# Patient Record
Sex: Male | Born: 1970 | Race: White | Hispanic: Yes | Marital: Single | Smoking: Current some day smoker
Health system: Southern US, Community
[De-identification: ages and names within clinical notes are randomized; demographics above are authoritative.]

## PROBLEM LIST (undated history)

## (undated) DIAGNOSIS — C801 Malignant (primary) neoplasm, unspecified: Secondary | ICD-10-CM

## (undated) HISTORY — PX: OTHER SURGICAL HISTORY: SHX169

## (undated) HISTORY — PX: REPLACEMENT TOTAL KNEE: SUR1224

---

## 2016-10-13 ENCOUNTER — Emergency Department (HOSPITAL_COMMUNITY)
Admission: EM | Admit: 2016-10-13 | Discharge: 2016-10-13 | Disposition: A | Discharge status: Home / Self Care | Payer: Self-pay | Attending: Emergency Medicine | Admitting: Emergency Medicine

## 2016-10-13 ENCOUNTER — Emergency Department (HOSPITAL_COMMUNITY): Payer: Self-pay

## 2016-10-13 ENCOUNTER — Encounter (HOSPITAL_COMMUNITY): Payer: Self-pay | Admitting: Emergency Medicine

## 2016-10-13 DIAGNOSIS — Y929 Unspecified place or not applicable: Secondary | ICD-10-CM | POA: Insufficient documentation

## 2016-10-13 DIAGNOSIS — X501XXA Overexertion from prolonged static or awkward postures, initial encounter: Secondary | ICD-10-CM | POA: Insufficient documentation

## 2016-10-13 DIAGNOSIS — Y999 Unspecified external cause status: Secondary | ICD-10-CM | POA: Insufficient documentation

## 2016-10-13 DIAGNOSIS — Y939 Activity, unspecified: Secondary | ICD-10-CM | POA: Insufficient documentation

## 2016-10-13 DIAGNOSIS — M79672 Pain in left foot: Secondary | ICD-10-CM | POA: Insufficient documentation

## 2016-10-13 DIAGNOSIS — Z96659 Presence of unspecified artificial knee joint: Secondary | ICD-10-CM | POA: Insufficient documentation

## 2016-10-13 NOTE — Discharge Instructions (Signed)
Rest - please stay off foot as much as possible until you can bear weight Ice - ice for 20 minutes at a time, several times a day Compression - wear brace to provide support Elevate - elevate foot above level of heart Ibuprofen - take with food. Take up to 3-4 times daily

## 2016-10-13 NOTE — Progress Notes (Signed)
Orthopedic Tech Progress Note Patient Details:  Jared Conley 01/07/1971 [id number]  Ortho Devices Type of Ortho Device: Crutches, Postop shoe/boot, Ace wrap Ortho Device/Splint Location: Lt foot Ortho Device/Splint Interventions: Application   [name] 10/13/2016, 12:23 PM

## 2016-10-13 NOTE — ED Provider Notes (Signed)
[facility] DEPT Provider Note   CSN: [id number] Arrival date & time: 10/13/16  0941  By signing my name below, I, Sonum Patel, attest that this documentation has been prepared under the direction and in the presence of [employer], PA-C. Electronically Signed: Sonum Patel, Education administrator. 10/13/16. 12:00 PM.  History   Chief Complaint Chief Complaint  Patient presents with  . Foot Pain    The history is provided by the patient. No language interpreter was used.     HPI Comments: Jared Conley is a 46 y.o. male who presents to the Emergency Department complaining of a left foot injury that occurred last night. PMHx significant for extensive left leg surgery due to bone cancer 15 years ago. He states he was walking up steps when he twisted his left foot. He reports current constant left foot and ankle pain with associated mild swelling. He states the pain is worse with ambulation and has difficulty moving his ankle from a prior surgery. He denies numbness or wounds.   History reviewed. No pertinent past medical history.  There are no active problems to display for this patient.   Past Surgical History:  Procedure Laterality Date  . REPLACEMENT TOTAL KNEE         Home Medications    Prior to Admission medications   Not on File    Family History Family History  Problem Relation Age of Onset  . Hypertension Father     Social History Social History  Substance Use Topics  . Smoking status: Never Smoker  . Smokeless tobacco: Never Used  . Alcohol use Yes     Comment: occ     Allergies   Patient has no allergy information on record.   Review of Systems Review of Systems  Musculoskeletal: Positive for arthralgias and joint swelling.  Skin: Negative for wound.  Neurological: Negative for numbness.     Physical Exam Updated Vital Signs BP 118/72 (BP Location: Left Arm)   Pulse 66   Temp 97.8 F (36.6 C) (Oral)   Resp 12   Ht 5\' 5"  (1.651 m)   Wt 175 lb  (79.4 kg)   SpO2 98%   BMI 29.12 kg/m   Physical Exam  Constitutional: He is oriented to person, place, and time. He appears well-developed and well-nourished.  HENT:  Head: Normocephalic and atraumatic.  Cardiovascular: Normal rate.   Pulmonary/Chest: Effort normal.  Musculoskeletal: He exhibits tenderness. He exhibits no edema or deformity.  LLE: No obvious swelling or deformity. Well healed surgical scar starts at mid shin and goes upward. Unable to range his left ankle which he has at baseline. Full ROM of toes. Tenderness along medial aspect of foot and plantar aspect of foot. NVI.   Neurological: He is alert and oriented to person, place, and time.  Skin: Skin is warm and dry.  Psychiatric: He has a normal mood and affect.  Nursing note and vitals reviewed.    ED Treatments / Results  DIAGNOSTIC STUDIES: Oxygen Saturation is 98% on RA, normal by my interpretation.    COORDINATION OF CARE: 11:59 AM Discussed treatment plan with pt at bedside and pt agreed to plan.   Labs (all labs ordered are listed, but only abnormal results are displayed) Labs Reviewed - No data to display  EKG  EKG Interpretation None       Radiology Dg Foot Complete Left  Result Date: 10/13/2016 CLINICAL DATA:  Twisted ankle EXAM: LEFT FOOT - COMPLETE 3+ VIEW COMPARISON:  None.  FINDINGS: Negative for fracture. Negative for arthropathy. Rod in the distal tibia. IMPRESSION: Negative for fracture. Electronically Signed   By: Franchot Gallo M.D.   On: 10/13/2016 11:36    Procedures Procedures (including critical care time)  Medications Ordered in ED Medications - No data to display   Initial Impression / Assessment and Plan / ED Course  I have reviewed the triage vital signs and the nursing notes.  Pertinent labs & imaging results that were available during my care of the patient were reviewed by me and considered in my medical decision making (see chart for details).  Patient X-Ray  negative for obvious fracture or dislocation.  Pt advised to follow up with orthopedics. Patient given crutches, ACE wrap, and post-op shoe while in ED, conservative therapy recommended and discussed. Patient will be discharged home & is agreeable with above plan. Returns precautions discussed. Pt appears safe for discharge.   Final Clinical Impressions(s) / ED Diagnoses   Final diagnoses:  Foot pain, left    New Prescriptions New Prescriptions   No medications on file   I personally performed the services described in this documentation, which was scribed in my presence. The recorded information has been reviewed and is accurate.    Recardo Evangelist, PA-C 10/13/16 [place], PA-C 10/13/16 [place], MD 10/15/16 2030

## 2016-10-13 NOTE — ED Triage Notes (Signed)
Pt c/o l/foot pain after twisting foot last night. Ptt drove self to ED. Pt reports that he is unable to walk due to pain

## 2016-10-13 NOTE — ED Notes (Signed)
Bed: WTR6 Expected date:  Expected time:  Means of arrival:  Comments: 

## 2016-10-13 NOTE — Progress Notes (Signed)
Prior to pt d/c CM spoke with pt with no pcp.  CM discussed and provided written information to assist pt with determining choice for uninsured accepting pcps, discussed the importance of pcp vs EDP services for f/u care, www.needymeds.org, www.goodrx.com, discounted pharmacies and other [employer] such as [employer] [facility] [employer], affordable care act, financial assistance, uninsured dental services, [place] med assist, DSS and  health department  Reviewed resources for [employer] uninsured accepting pcps like [facility], family medicine at [employer], [facility], [facility], local urgent care centers, [facility], [facility] family practice, general medical clinics, family services of the [place], [facility] urgent care plus others, medication resources, [facility] out patient pharmacies and housing Pt voiced understanding and appreciation of resources provided   Provided [facility] contact information

## 2021-12-31 ENCOUNTER — Encounter (HOSPITAL_COMMUNITY): Payer: Self-pay

## 2021-12-31 ENCOUNTER — Other Ambulatory Visit: Payer: Self-pay

## 2021-12-31 ENCOUNTER — Emergency Department (HOSPITAL_COMMUNITY)
Admission: EM | Admit: 2021-12-31 | Discharge: 2022-01-01 | Disposition: A | Discharge status: Home / Self Care | Payer: Self-pay | Attending: Emergency Medicine | Admitting: Emergency Medicine

## 2021-12-31 DIAGNOSIS — Z85830 Personal history of malignant neoplasm of bone: Secondary | ICD-10-CM | POA: Insufficient documentation

## 2021-12-31 DIAGNOSIS — R1084 Generalized abdominal pain: Secondary | ICD-10-CM | POA: Insufficient documentation

## 2021-12-31 DIAGNOSIS — R110 Nausea: Secondary | ICD-10-CM | POA: Insufficient documentation

## 2021-12-31 DIAGNOSIS — R1013 Epigastric pain: Secondary | ICD-10-CM | POA: Insufficient documentation

## 2021-12-31 HISTORY — DX: Malignant (primary) neoplasm, unspecified: C80.1

## 2021-12-31 MED ORDER — SODIUM CHLORIDE 0.9 % IV BOLUS
1000.0000 mL | Freq: Once | INTRAVENOUS | Status: AC
  Administered 2022-01-01: 1000 mL via INTRAVENOUS

## 2021-12-31 MED ORDER — ONDANSETRON HCL 4 MG/2ML IJ SOLN
4.0000 mg | Freq: Once | INTRAMUSCULAR | Status: AC
  Administered 2022-01-01: 4 mg via INTRAVENOUS
  Filled 2021-12-31: qty 2

## 2021-12-31 MED ORDER — HYDROMORPHONE HCL 1 MG/ML IJ SOLN
1.0000 mg | Freq: Once | INTRAMUSCULAR | Status: AC
  Administered 2022-01-01: 1 mg via INTRAVENOUS
  Filled 2021-12-31: qty 1

## 2021-12-31 NOTE — ED Triage Notes (Signed)
Pt from home via [facility], c/o epigastric pain since 4 pm after eating chicken and rice, took Milk of Magnesia w/o relief. Pt c/o nausea, denies v/d.  ?

## 2021-12-31 NOTE — ED Provider Notes (Signed)
?[place] DEPT ?Provider Note ? ? ?CSN: [id number] ?Arrival date & time: 12/31/21  2340 ? ?  ? ?History ? ?Chief Complaint  ?Patient presents with  ? Abdominal Pain  ? ? ?Jared Conley is a 51 y.o. male. ? ?The history is provided by the patient, the EMS personnel and medical records.  ?Abdominal Pain ?Jared Conley is a 51 y.o. male who presents to the Emergency Department complaining of abdominal pain.  He presents to the emergency department by EMS for evaluation of sudden onset and severe epigastric pain that started about 4 PM after eating chicken and rice.  He has associated nausea.  No fevers, vomiting, difficulty breathing, chest pain, diarrhea, dysuria.  No prior similar symptoms.  He has a remote history of bone cancer status post treatment 30+ years ago.  No additional medical problems.  He drinks occasional alcohol. ?  ? ?Home Medications ?Prior to Admission medications   ?Medication Sig Start Date End Date Taking? Authorizing Provider  ?famotidine (PEPCID) 20 MG tablet Take 1 tablet (20 mg total) by mouth 2 (two) times daily. 01/01/22  Yes Quintella Reichert, MD  ?ondansetron [facility]) 4 MG tablet Take 1 tablet (4 mg total) by mouth every 6 (six) hours as needed for nausea or vomiting. 01/01/22  Yes Quintella Reichert, MD  ?   ? ?Allergies    ?Patient has no known allergies.   ? ?Review of Systems   ?Review of Systems  ?Gastrointestinal:  Positive for abdominal pain.  ?All other systems reviewed and are negative. ? ?Physical Exam ?Updated Vital Signs ?BP (!) 157/97   Pulse 68   Temp 97.7 ?F (36.5 ?C) (Oral) Comment: Simultaneous filing. User may not have seen previous data. Comment (Src): Simultaneous filing. User may not have seen previous data.  Resp 20   Ht '5\' 7"'$  (1.702 m)   Wt 72.6 kg   SpO2 97%   BMI 25.06 kg/m?  ?Physical Exam ?Vitals and nursing note reviewed.  ?Constitutional:   ?   General: He is in acute distress.  ?   Appearance: He is  well-developed.  ?HENT:  ?   Head: Normocephalic and atraumatic.  ?Cardiovascular:  ?   Rate and Rhythm: Normal rate and regular rhythm.  ?   Heart sounds: No murmur heard. ?Pulmonary:  ?   Effort: Pulmonary effort is normal. No respiratory distress.  ?   Breath sounds: Normal breath sounds.  ?Abdominal:  ?   Palpations: Abdomen is soft.  ?   Tenderness: There is abdominal tenderness. There is no guarding or rebound.  ?   Comments: Moderate generalized abdominal tenderness  ?Musculoskeletal:     ?   General: No swelling or tenderness.  ?   Comments: Left lower extremity is smaller than the right lower extremity chronically in nature with scar to the tib-fib area.  2+ pedal pulses bilaterally.  ?Skin: ?   General: Skin is warm and dry.  ?Neurological:  ?   Mental Status: He is alert and oriented to person, place, and time.  ?Psychiatric:     ?   Behavior: Behavior normal.  ? ? ?ED Results / Procedures / Treatments   ?Labs ?(all labs ordered are listed, but only abnormal results are displayed) ?Labs Reviewed  ?COMPREHENSIVE METABOLIC PANEL - Abnormal; Notable for the following components:  ?    Result Value  ? Glucose, Bld 122 (*)   ? All other components within normal limits  ?CBC WITH DIFFERENTIAL/PLATELET - Abnormal;  Notable for the following components:  ? WBC 13.0 (*)   ? Neutro Abs 10.6 (*)   ? All other components within normal limits  ?LIPASE, BLOOD  ? ? ?EKG ?EKG Interpretation ? ?Date/Time:  Friday December 31 2021 23:58:04 EDT ?Ventricular Rate:  59 ?PR Interval:  132 ?QRS Duration: 93 ?QT Interval:  392 ?QTC Calculation: 389 ?R Axis:   26 ?Text Interpretation: Sinus rhythm ST elev, probable normal early repol pattern No previous ECGs available Confirmed by Quintella Reichert [phone]) on 01/01/2022 12:02:53 AM ? ?Radiology ?CT Abdomen Pelvis W Contrast ? ?Result Date: 01/01/2022 ?CLINICAL DATA:  Epigastric pain EXAM: CT ABDOMEN AND PELVIS WITH CONTRAST TECHNIQUE: Multidetector CT imaging of the abdomen and pelvis  was performed using the standard protocol following bolus administration of intravenous contrast. RADIATION DOSE REDUCTION: This exam was performed according to the departmental dose-optimization program which includes automated exposure control, adjustment of the mA and/or kV according to patient size and/or use of iterative reconstruction technique. CONTRAST:  121m OMNIPAQUE IOHEXOL 300 MG/ML  SOLN COMPARISON:  None. FINDINGS: Lower chest: No acute abnormality. Hepatobiliary: No focal liver abnormality is seen. No gallstones, gallbladder wall thickening, or biliary dilatation. Pancreas: Unremarkable. No pancreatic ductal dilatation or surrounding inflammatory changes. Spleen: Normal in size without focal abnormality. Adrenals/Urinary Tract: Adrenal glands are unremarkable. Kidneys are normal, without renal calculi, focal lesion, or hydronephrosis. Bladder is unremarkable. Stomach/Bowel: Moderate fluid distension of the stomach. Fluid-filled borderline distended proximal and mid small bowel loops. Gradual transition to decompressed distal small bowel. No acute bowel wall thickening. Negative appendix. Vascular/Lymphatic: No significant vascular findings are present. No enlarged abdominal or pelvic lymph nodes. Reproductive: Prostate is unremarkable. Other: No abdominal wall hernia or abnormality. No abdominopelvic ascites. Musculoskeletal: No acute or significant osseous findings. IMPRESSION: 1. Moderate fluid distension of the stomach with fluid-filled borderline distended proximal and mid small bowel loops, gradual transition to decompressed distal small bowel, findings could be secondary to ileus versus partial or developing small bowel obstruction. There is no free air. Electronically Signed   By: KDonavan FoilM.D.   On: 01/01/2022 01:07  ? ?DG Chest Port 1 View ? ?Result Date: 01/01/2022 ?CLINICAL DATA:  Chest pain EXAM: PORTABLE CHEST 1 VIEW COMPARISON:  None. FINDINGS: The heart size and mediastinal  contours are within normal limits. Both lungs are clear. The visualized skeletal structures are unremarkable. IMPRESSION: No active disease. Electronically Signed   By: KDonavan FoilM.D.   On: 01/01/2022 00:34   ? ?Procedures ?Procedures  ? ? ?Medications Ordered in ED ?Medications  ?HYDROmorphone (DILAUDID) injection 1 mg (1 mg Intravenous Given 01/01/22 0003)  ?ondansetron ([facility] injection 4 mg (4 mg Intravenous Given 01/01/22 0003)  ?sodium chloride 0.9 % bolus 1,000 mL (0 mLs Intravenous Stopped 01/01/22 0118)  ?iohexol (OMNIPAQUE) 300 MG/ML solution 100 mL (100 mLs Intravenous Contrast Given 01/01/22 0049)  ?famotidine (PEPCID) IVPB 20 mg premix (0 mg Intravenous Stopped 01/01/22 0314)  ?alum & mag hydroxide-simeth (MAALOX/MYLANTA) 200-200-20 MG/5ML suspension 30 mL (30 mLs Oral Given 01/01/22 0217)  ?fentaNYL (SUBLIMAZE) injection 50 mcg (50 mcg Intravenous Given 01/01/22 0220)  ? ? ?ED Course/ Medical Decision Making/ A&P ?  ?                        ?Medical Decision Making ?Amount and/or Complexity of Data Reviewed ?Labs: ordered. ?Radiology: ordered. ? ?Risk ?OTC drugs. ?Prescription drug management. ? ? ?Patient here for evaluation of acute onset epigastric pain after  eating chicken and rice.  Patient distressed on ED presentation with epigastric tenderness, severe pain.  He was treated with medications in the emergency department with improvement in his symptoms.  Labs significant for mild leukocytosis.  CT abdomen pelvis with moderate distention of the stomach and fluid-filled borderline distended proximal and mid small bowel loops, ileus versus partial or developing SBO.  Patient without prior abdominal surgeries, he was observed for multiple hours in the emergency department and able to tolerate oral fluids.  Patient with out recurrent pain, vomiting after several hour ED observation.  Feel he is stable for discharge home at this time.  Discussed close return precautions if he has progressive or new  concerning symptoms.  Recommend liquid diet that he can advance as tolerated.  Current clinical picture is not consistent with perforated viscus, SBO, pancreatitis, cholecystitis, dissection. ? ? ? ? ? ? ? ?Final Clinical Impression(s)

## 2022-01-01 ENCOUNTER — Emergency Department (HOSPITAL_COMMUNITY): Payer: Self-pay

## 2022-01-01 ENCOUNTER — Encounter (HOSPITAL_COMMUNITY): Payer: Self-pay

## 2022-01-01 LAB — CBC WITH DIFFERENTIAL/PLATELET
Abs Immature Granulocytes: 0.07 10*3/uL (ref 0.00–0.07)
Basophils Absolute: 0.1 10*3/uL (ref 0.0–0.1)
Basophils Relative: 1 %
Eosinophils Absolute: 0 10*3/uL (ref 0.0–0.5)
Eosinophils Relative: 0 %
HCT: 47.2 % (ref 39.0–52.0)
Hemoglobin: 16.3 g/dL (ref 13.0–17.0)
Immature Granulocytes: 1 %
Lymphocytes Relative: 10 %
Lymphs Abs: 1.3 10*3/uL (ref 0.7–4.0)
MCH: 32.3 pg (ref 26.0–34.0)
MCHC: 34.5 g/dL (ref 30.0–36.0)
MCV: 93.5 fL (ref 80.0–100.0)
Monocytes Absolute: 1 10*3/uL (ref 0.1–1.0)
Monocytes Relative: 7 %
Neutro Abs: 10.6 10*3/uL — ABNORMAL HIGH (ref 1.7–7.7)
Neutrophils Relative %: 81 %
Platelets: 299 10*3/uL (ref 150–400)
RBC: 5.05 MIL/uL (ref 4.22–5.81)
RDW: 14.7 % (ref 11.5–15.5)
WBC: 13 10*3/uL — ABNORMAL HIGH (ref 4.0–10.5)
nRBC: 0 % (ref 0.0–0.2)

## 2022-01-01 LAB — COMPREHENSIVE METABOLIC PANEL
ALT: 29 U/L (ref 0–44)
AST: 39 U/L (ref 15–41)
Albumin: 4 g/dL (ref 3.5–5.0)
Alkaline Phosphatase: 70 U/L (ref 38–126)
Anion gap: 5 (ref 5–15)
BUN: 12 mg/dL (ref 6–20)
CO2: 28 mmol/L (ref 22–32)
Calcium: 9.3 mg/dL (ref 8.9–10.3)
Chloride: 106 mmol/L (ref 98–111)
Creatinine, Ser: 1.01 mg/dL (ref 0.61–1.24)
GFR, Estimated: >60 mL/min (ref >60)
Glucose, Bld: 122 mg/dL — ABNORMAL HIGH (ref 70–99)
Potassium: 4 mmol/L (ref 3.5–5.1)
Sodium: 139 mmol/L (ref 135–145)
Total Bilirubin: 0.8 mg/dL (ref 0.3–1.2)
Total Protein: 6.9 g/dL (ref 6.5–8.1)

## 2022-01-01 LAB — LIPASE, BLOOD: Lipase: 25 U/L (ref 11–51)

## 2022-01-01 MED ORDER — IOHEXOL 300 MG/ML  SOLN
100.0000 mL | Freq: Once | INTRAMUSCULAR | Status: AC | PRN
  Administered 2022-01-01: 100 mL via INTRAVENOUS

## 2022-01-01 MED ORDER — FAMOTIDINE 20 MG PO TABS: 20.0000 mg | ORAL_TABLET | Freq: Two times a day (BID) | ORAL | 0 refills | Status: AC

## 2022-01-01 MED ORDER — FENTANYL CITRATE PF 50 MCG/ML IJ SOSY
50.0000 ug | PREFILLED_SYRINGE | Freq: Once | INTRAMUSCULAR | Status: AC
  Administered 2022-01-01: 50 ug via INTRAVENOUS
  Filled 2022-01-01: qty 1

## 2022-01-01 MED ORDER — ONDANSETRON HCL 4 MG PO TABS: 4.0000 mg | ORAL_TABLET | Freq: Four times a day (QID) | ORAL | 0 refills | Status: DC | PRN

## 2022-01-01 MED ORDER — ALUM & MAG HYDROXIDE-SIMETH 200-200-20 MG/5ML PO SUSP
30.0000 mL | Freq: Once | ORAL | Status: AC
  Administered 2022-01-01: 30 mL via ORAL
  Filled 2022-01-01: qty 30

## 2022-01-01 MED ORDER — FAMOTIDINE IN NACL 20-0.9 MG/50ML-% IV SOLN
20.0000 mg | Freq: Once | INTRAVENOUS | Status: AC
  Administered 2022-01-01: 20 mg via INTRAVENOUS
  Filled 2022-01-01: qty 50

## 2022-01-01 NOTE — ED Notes (Signed)
Pt reports feels "a lot better, no pain right now".  ?

## 2022-01-01 NOTE — ED Notes (Signed)
MD at the bedside to reassess pt  ?

## 2022-01-01 NOTE — ED Notes (Signed)
Pt given gingerale for PO challenge 

## 2022-01-01 NOTE — ED Notes (Signed)
Pt reports pain is better, resting comfortably, NAD noted, reports no concerns or needs.  ?

## 2022-01-01 NOTE — ED Notes (Signed)
Pt return from CT.

## 2022-01-01 NOTE — ED Notes (Signed)
Pt to CT at this time.

## 2022-06-23 ENCOUNTER — Other Ambulatory Visit: Payer: Self-pay

## 2022-06-23 ENCOUNTER — Encounter (HOSPITAL_COMMUNITY): Payer: Self-pay

## 2022-06-23 ENCOUNTER — Emergency Department (HOSPITAL_COMMUNITY): Payer: Self-pay

## 2022-06-23 ENCOUNTER — Emergency Department (HOSPITAL_COMMUNITY)
Admission: EM | Admit: 2022-06-23 | Discharge: 2022-06-23 | Disposition: A | Discharge status: Home / Self Care | Payer: Self-pay | Attending: Emergency Medicine | Admitting: Emergency Medicine

## 2022-06-23 DIAGNOSIS — R112 Nausea with vomiting, unspecified: Secondary | ICD-10-CM | POA: Insufficient documentation

## 2022-06-23 DIAGNOSIS — Z20822 Contact with and (suspected) exposure to covid-19: Secondary | ICD-10-CM | POA: Insufficient documentation

## 2022-06-23 DIAGNOSIS — R1084 Generalized abdominal pain: Secondary | ICD-10-CM | POA: Insufficient documentation

## 2022-06-23 DIAGNOSIS — R197 Diarrhea, unspecified: Secondary | ICD-10-CM | POA: Insufficient documentation

## 2022-06-23 LAB — CBC WITH DIFFERENTIAL/PLATELET
Abs Immature Granulocytes: 0.04 10*3/uL (ref 0.00–0.07)
Basophils Absolute: 0 10*3/uL (ref 0.0–0.1)
Basophils Relative: 1 %
Eosinophils Absolute: 0.1 10*3/uL (ref 0.0–0.5)
Eosinophils Relative: 1 %
HCT: 53.5 % — ABNORMAL HIGH (ref 39.0–52.0)
Hemoglobin: 18.1 g/dL — ABNORMAL HIGH (ref 13.0–17.0)
Immature Granulocytes: 1 %
Lymphocytes Relative: 16 %
Lymphs Abs: 1.3 10*3/uL (ref 0.7–4.0)
MCH: 31.7 pg (ref 26.0–34.0)
MCHC: 33.8 g/dL (ref 30.0–36.0)
MCV: 93.7 fL (ref 80.0–100.0)
Monocytes Absolute: 0.7 10*3/uL (ref 0.1–1.0)
Monocytes Relative: 9 %
Neutro Abs: 6.1 10*3/uL (ref 1.7–7.7)
Neutrophils Relative %: 72 %
Platelets: 223 10*3/uL (ref 150–400)
RBC: 5.71 MIL/uL (ref 4.22–5.81)
RDW: 13.9 % (ref 11.5–15.5)
WBC: 8.3 10*3/uL (ref 4.0–10.5)
nRBC: 0 % (ref 0.0–0.2)

## 2022-06-23 LAB — URINALYSIS, ROUTINE W REFLEX MICROSCOPIC
Bilirubin Urine: NEGATIVE
Glucose, UA: NEGATIVE mg/dL
Hgb urine dipstick: NEGATIVE
Ketones, ur: 80 mg/dL — AB
Leukocytes,Ua: NEGATIVE
Nitrite: NEGATIVE
Protein, ur: NEGATIVE mg/dL
Specific Gravity, Urine: 1.023 (ref 1.005–1.030)
pH: 6 (ref 5.0–8.0)

## 2022-06-23 LAB — COMPREHENSIVE METABOLIC PANEL
ALT: 19 U/L (ref 0–44)
AST: 25 U/L (ref 15–41)
Albumin: 4 g/dL (ref 3.5–5.0)
Alkaline Phosphatase: 70 U/L (ref 38–126)
Anion gap: 10 (ref 5–15)
BUN: 11 mg/dL (ref 6–20)
CO2: 24 mmol/L (ref 22–32)
Calcium: 9.3 mg/dL (ref 8.9–10.3)
Chloride: 102 mmol/L (ref 98–111)
Creatinine, Ser: 0.85 mg/dL (ref 0.61–1.24)
GFR, Estimated: >60 mL/min (ref >60)
Glucose, Bld: 100 mg/dL — ABNORMAL HIGH (ref 70–99)
Potassium: 4.1 mmol/L (ref 3.5–5.1)
Sodium: 136 mmol/L (ref 135–145)
Total Bilirubin: 0.9 mg/dL (ref 0.3–1.2)
Total Protein: 7.4 g/dL (ref 6.5–8.1)

## 2022-06-23 LAB — TROPONIN I (HIGH SENSITIVITY)
Troponin I (High Sensitivity): 3 ng/L (ref <18)
Troponin I (High Sensitivity): 2 ng/L (ref <18)

## 2022-06-23 LAB — LIPASE, BLOOD: Lipase: 22 U/L (ref 11–51)

## 2022-06-23 LAB — SARS CORONAVIRUS 2 BY RT PCR: SARS Coronavirus 2 by RT PCR: NEGATIVE

## 2022-06-23 MED ORDER — KETOROLAC TROMETHAMINE 15 MG/ML IJ SOLN
15.0000 mg | Freq: Once | INTRAMUSCULAR | Status: AC
  Administered 2022-06-23: 15 mg via INTRAVENOUS
  Filled 2022-06-23: qty 1

## 2022-06-23 MED ORDER — ONDANSETRON HCL 4 MG/2ML IJ SOLN
4.0000 mg | Freq: Once | INTRAMUSCULAR | Status: AC
  Administered 2022-06-23: 4 mg via INTRAVENOUS
  Filled 2022-06-23: qty 2

## 2022-06-23 MED ORDER — SODIUM CHLORIDE 0.9 % IV BOLUS
1000.0000 mL | Freq: Once | INTRAVENOUS | Status: AC
  Administered 2022-06-23: 1000 mL via INTRAVENOUS

## 2022-06-23 MED ORDER — ONDANSETRON HCL 4 MG PO TABS: 4.0000 mg | ORAL_TABLET | Freq: Four times a day (QID) | ORAL | 0 refills | Status: AC

## 2022-06-23 NOTE — ED Provider Triage Note (Signed)
Emergency Medicine Provider Triage Evaluation Note  Jared Conley , a 51 y.o. male  was evaluated in triage.  Pt complains of epigastric pain, onset this morning, thinks related to something he ate last night. Nausea, vomiting, non bloody non bilious.   Review of Systems  Positive: As above Negative: CP  Physical Exam  There were no vitals taken for this visit. Gen:   Awake, no distress   Resp:  Normal effort  MSK:   Moves extremities without difficulty  Other:    Medical Decision Making  Medically screening exam initiated at 2:15 PM.  Appropriate orders placed.  Heriberto Stmartin was informed that the remainder of the evaluation will be completed by another provider, this initial triage assessment does not replace that evaluation, and the importance of remaining in the ED until their evaluation is complete.     Tacy Learn, PA-C 06/23/22 1416

## 2022-06-23 NOTE — Discharge Instructions (Signed)
Please follow-up with your PCP. Eat bland foods and take nausea meds as needed. If you feel like your symptoms are getting worse please return to the ED.

## 2022-06-23 NOTE — ED Triage Notes (Signed)
Per EMS- Patient c/o upper abdominal pain, N/V that he rates 8/10. No distention.

## 2022-06-23 NOTE — ED Provider Notes (Signed)
[place] DEPT Provider Note   CSN: [id number] Arrival date & time: 06/23/22  1409     History  No chief complaint on file.   Jared Conley is a 51 y.o. male, no pertinent past medical history, who presents to the ED secondary to epigastric pain for the several hours.  States that it started this a.m., and he has had 2 episodes of vomiting, and one episode of diarrhea today.  The episodes have been nonbloody.  Reports diffuse abdominal pain, with epigastric pain being most prominent.  Denies any odd food.  No recent illnesses.  No fever or chills.  Denies any abdominal surgeries.  States the pain is sharp and stabbing.       Home Medications Prior to Admission medications   Medication Sig Start Date End Date Taking? Authorizing Provider  ondansetron (ZOFRAN) 4 MG tablet Take 1 tablet (4 mg total) by mouth every 6 (six) hours. 06/23/22  Yes Kandiss Ihrig L, PA  famotidine (PEPCID) 20 MG tablet Take 1 tablet (20 mg total) by mouth 2 (two) times daily. 01/01/22   Quintella Reichert, MD      Allergies    Patient has no known allergies.    Review of Systems   Review of Systems  Physical Exam Updated Vital Signs BP 128/80   Pulse 68   Temp 97.7 F (36.5 C) (Oral)   Resp (!) 26   Ht '5\' 3"'$  (1.6 m)   Wt 77.1 kg   SpO2 98%   BMI 30.11 kg/m  Physical Exam Vitals and nursing note reviewed.  Constitutional:      General: He is not in acute distress.    Appearance: He is well-developed.  HENT:     Head: Normocephalic and atraumatic.  Eyes:     Conjunctiva/sclera: Conjunctivae normal.  Cardiovascular:     Rate and Rhythm: Normal rate and regular rhythm.     Heart sounds: No murmur heard. Pulmonary:     Breath sounds: Normal breath sounds.  Abdominal:     Palpations: Abdomen is soft.     Tenderness: There is generalized abdominal tenderness and tenderness in the epigastric area. There is no guarding.     Comments: Epigastric>generalized   Musculoskeletal:        General: No swelling.     Cervical back: Neck supple.  Skin:    General: Skin is warm and dry.     Capillary Refill: Capillary refill takes less than 2 seconds.  Neurological:     Mental Status: He is alert.  Psychiatric:        Mood and Affect: Mood normal.     ED Results / Procedures / Treatments   Labs (all labs ordered are listed, but only abnormal results are displayed) Labs Reviewed  CBC WITH DIFFERENTIAL/PLATELET - Abnormal; Notable for the following components:      Result Value   Hemoglobin 18.1 (*)    HCT 53.5 (*)    All other components within normal limits  COMPREHENSIVE METABOLIC PANEL - Abnormal; Notable for the following components:   Glucose, Bld 100 (*)    All other components within normal limits  URINALYSIS, ROUTINE W REFLEX MICROSCOPIC - Abnormal; Notable for the following components:   Ketones, ur 80 (*)    All other components within normal limits  SARS CORONAVIRUS 2 BY RT PCR  LIPASE, BLOOD  TROPONIN I (HIGH SENSITIVITY)  TROPONIN I (HIGH SENSITIVITY)    EKG None  Radiology DG Chest 1  View  Result Date: 06/23/2022 CLINICAL DATA:  Epigastric pain EXAM: CHEST  1 VIEW COMPARISON:  01/01/2022 FINDINGS: The heart size and mediastinal contours are within normal limits. Both lungs are clear. The visualized skeletal structures are unremarkable. IMPRESSION: No active disease. Electronically Signed   By: Donavan Foil M.D.   On: 06/23/2022 16:24    Procedures Procedures    Medications Ordered in ED Medications  ondansetron (ZOFRAN) injection 4 mg (4 mg Intravenous Given 06/23/22 1559)  ketorolac (TORADOL) 15 MG/ML injection 15 mg (15 mg Intravenous Given 06/23/22 1559)  sodium chloride 0.9 % bolus 1,000 mL (1,000 mLs Intravenous New Bag/Given 06/23/22 1558)    ED Course/ Medical Decision Making/ A&P                           Medical Decision Making Amount and/or Complexity of Data Reviewed Radiology:  ordered.  Risk Prescription drug management.   Patient is a 51 year old male coming here for abdominal pain that started this AM.  Has some nausea, vomiting, diarrhea.  Well-appearing on exam, he has no guarding, just generalized tenderness to palpation of his abdomen.  Labs are within normal limits.  No evidence of leukocytosis, troponins within normal limits.  His symptoms resolved with Zofran, Toradol, and IV fluids.  He is able to tolerate p.o. intake.  He was discharged home with return precautions.  Chest x-ray was also unremarkable. EKG showed NSR.  Zofran was sent to the pharmacy symptoms likely secondary to gastroenteritis likely from some food exposure. Final Clinical Impression(s) / ED Diagnoses Final diagnoses:  Generalized abdominal pain  Nausea vomiting and diarrhea    Rx / DC Orders ED Discharge Orders          Ordered    ondansetron (ZOFRAN) 4 MG tablet  Every 6 hours        06/23/22 1947              Lillian Tigges, Si Gaul, PA 06/23/22 2005    Ezequiel Essex, MD 06/24/22 [phone]

## 2022-12-14 IMAGING — CT CT ABD-PELV W/ CM
2 of 5 series · 16 of 46 positions shown, 18 images · IV contrast (agent unspecified)
Comparison: None.

CLINICAL DATA: Epigastric pain

EXAM:
CT ABDOMEN AND PELVIS WITH CONTRAST
TECHNIQUE: Multidetector CT imaging of the abdomen and pelvis was performed
using the standard protocol following bolus administration of
intravenous contrast.

[Series 2: axial st · axial · 0.73mm/px · z∈[+1034,+1424]mm · 13 of 92 slices shown, 15 images]
[im 7/92  soft-tissue]
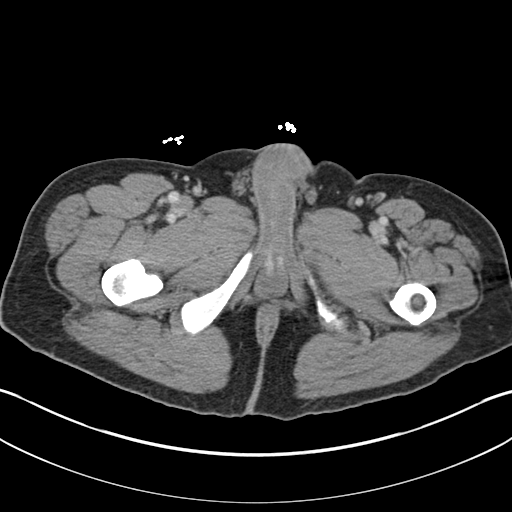
[im 7/92  bone]
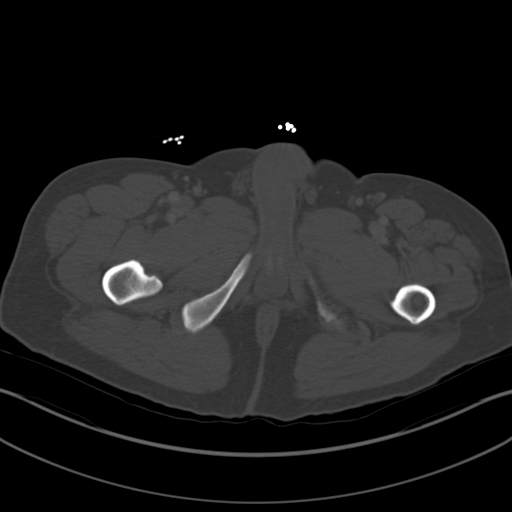
[im 14/92  soft-tissue]
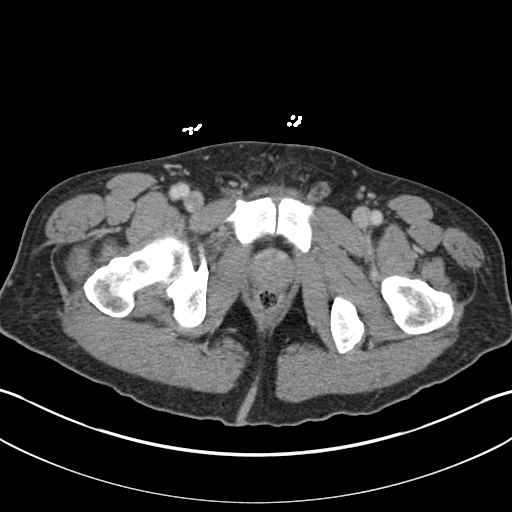
[im 20/92  soft-tissue]
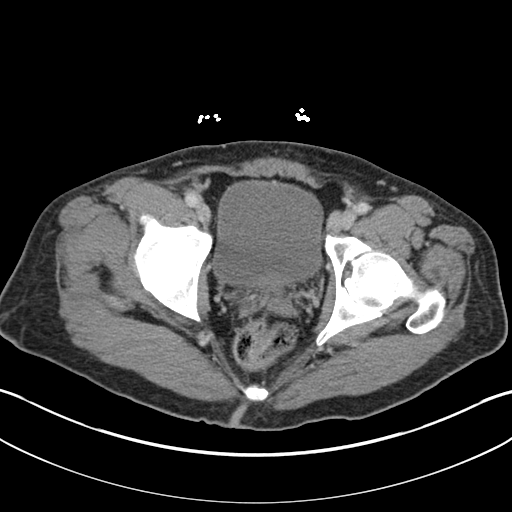
[im 27/92  soft-tissue]
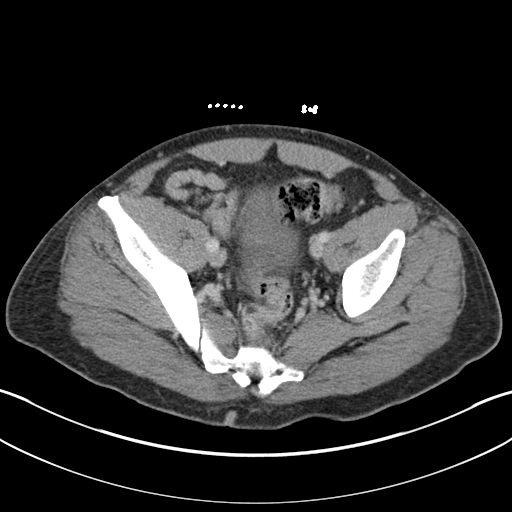
[im 33/92  soft-tissue]
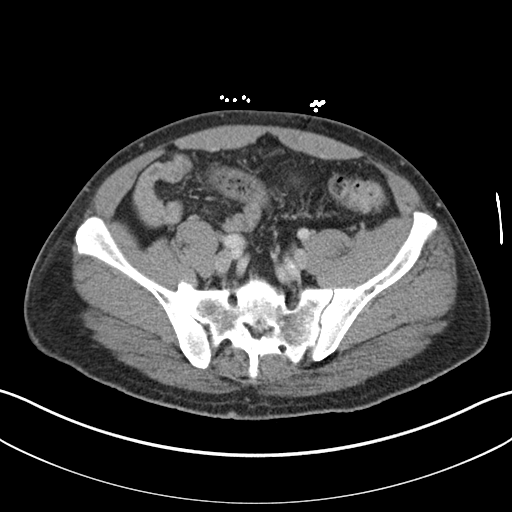
[im 40/92  soft-tissue]
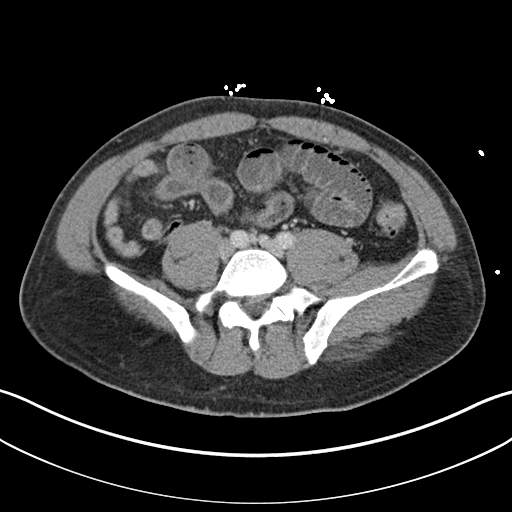
[im 46/92  soft-tissue]
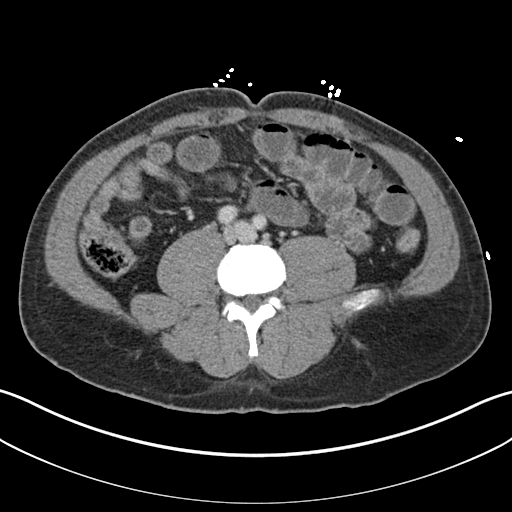
[im 53/92  soft-tissue]
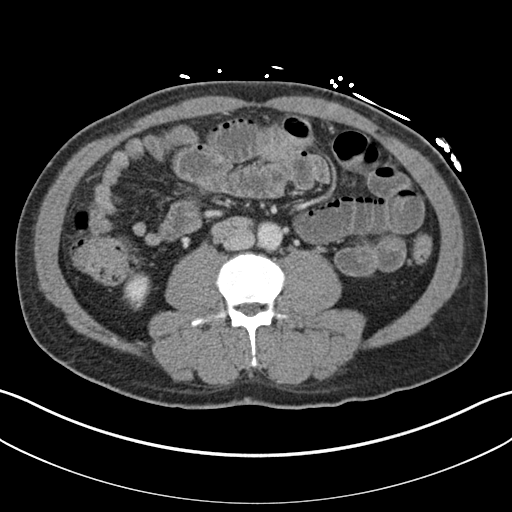
[im 59/92  soft-tissue]
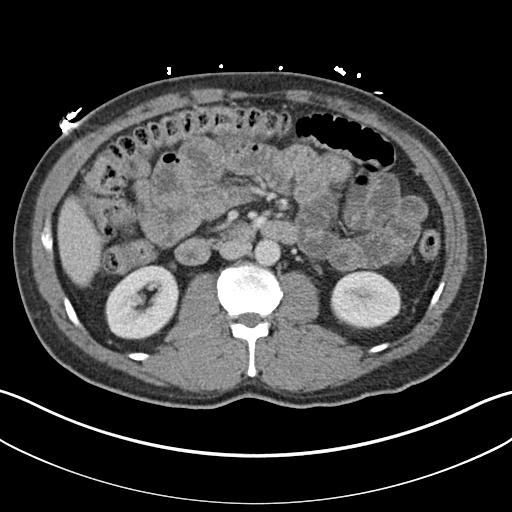
[im 59/92  bone]
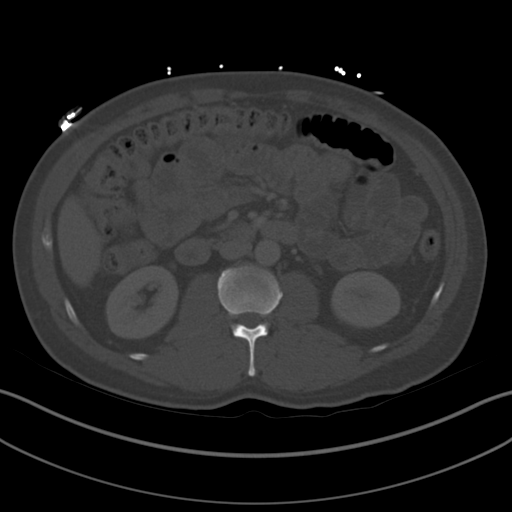
[im 66/92  soft-tissue]
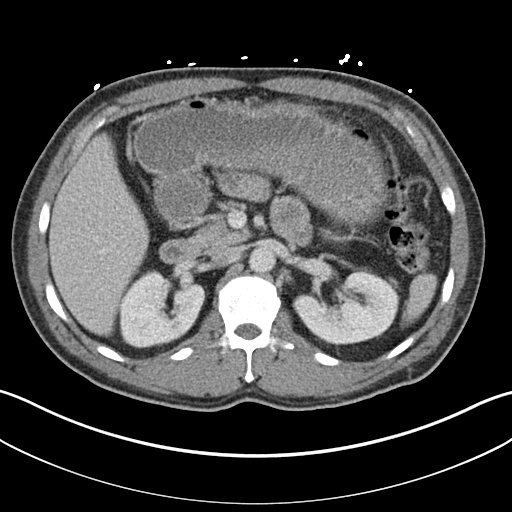
[im 72/92  soft-tissue]
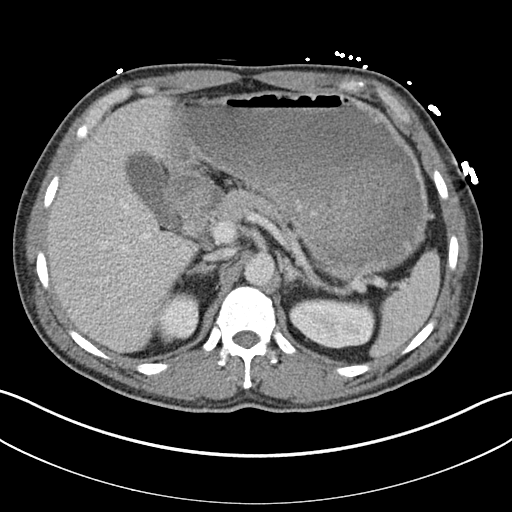
[im 79/92  soft-tissue]
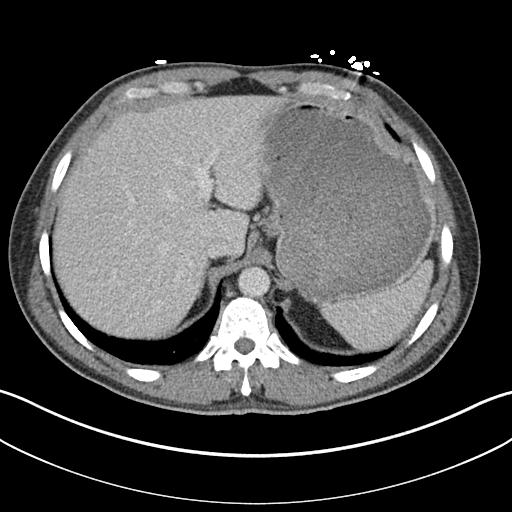
[im 85/92  soft-tissue]
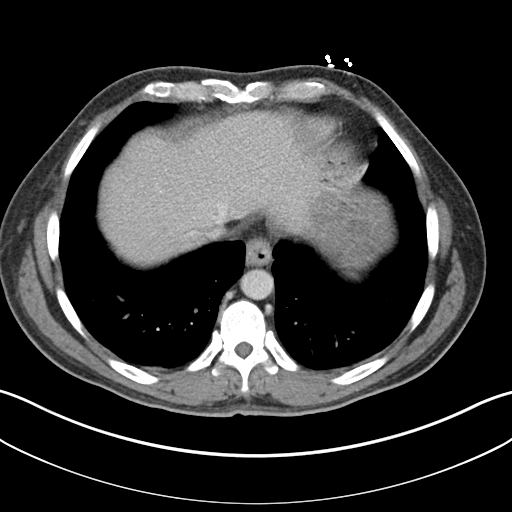

[Series 5: coronal st · coronal · 0.78mm/px · 3 of 147 slices shown]
[im 49/147  soft-tissue]
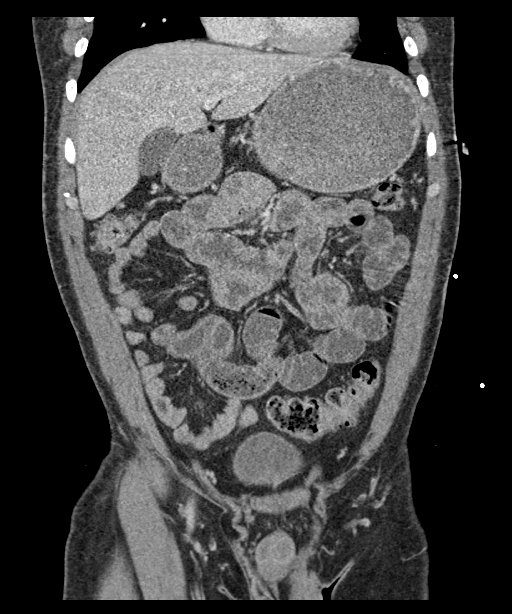
[im 65/147  soft-tissue]
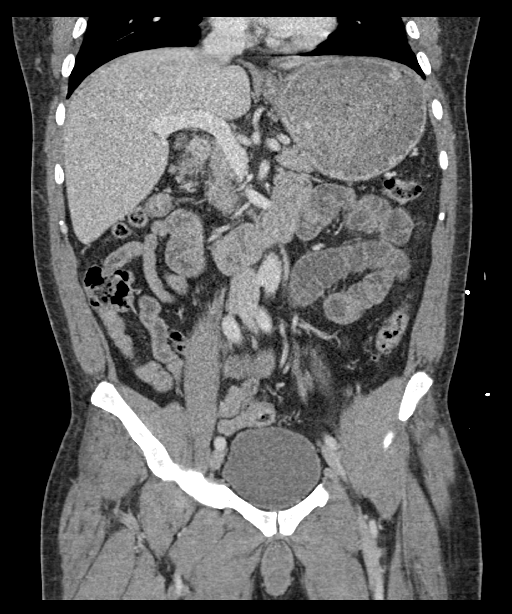
[im 82/147  soft-tissue]
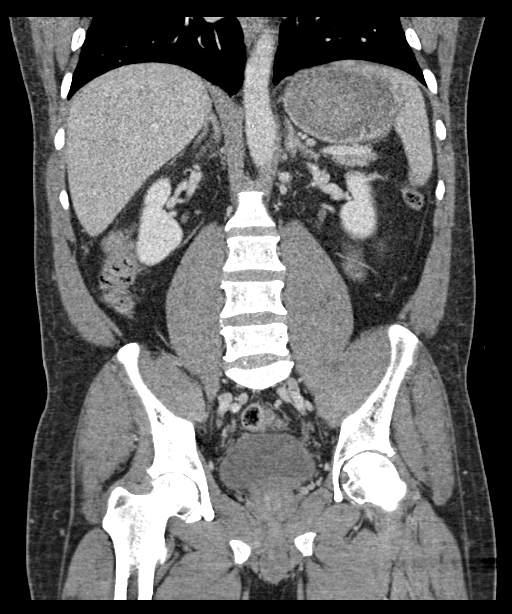

[16 of 46 positions shown; findings below may reference images not displayed]

RADIATION DOSE REDUCTION: This exam was performed according to the
departmental dose-optimization program which includes automated
exposure control, adjustment of the mA and/or kV according to
patient size and/or use of iterative reconstruction technique.

CONTRAST:  100mL OMNIPAQUE IOHEXOL 300 MG/ML  SOLN
FINDINGS: Lower chest: No acute abnormality.

Hepatobiliary: No focal liver abnormality is seen. No gallstones,
gallbladder wall thickening, or biliary dilatation.

Pancreas: Unremarkable. No pancreatic ductal dilatation or
surrounding inflammatory changes.

Spleen: Normal in size without focal abnormality.

Adrenals/Urinary Tract: Adrenal glands are unremarkable. Kidneys are
normal, without renal calculi, focal lesion, or hydronephrosis.
Bladder is unremarkable.

Stomach/Bowel: Moderate fluid distension of the stomach.
Fluid-filled borderline distended proximal and mid small bowel
loops. Gradual transition to decompressed distal small bowel. No
acute bowel wall thickening. Negative appendix.

Vascular/Lymphatic: No significant vascular findings are present. No
enlarged abdominal or pelvic lymph nodes.

Reproductive: Prostate is unremarkable.

Other: No abdominal wall hernia or abnormality. No abdominopelvic
ascites.

Musculoskeletal: No acute or significant osseous findings.
IMPRESSION: 1. Moderate fluid distension of the stomach with fluid-filled
borderline distended proximal and mid small bowel loops, gradual
transition to decompressed distal small bowel, findings could be
secondary to ileus versus partial or developing small bowel
obstruction. There is no free air.

## 2023-07-22 ENCOUNTER — Emergency Department (HOSPITAL_COMMUNITY)
Admission: EM | Admit: 2023-07-22 | Discharge: 2023-07-22 | Disposition: A | Discharge status: Home / Self Care | Payer: Self-pay | Attending: Emergency Medicine | Admitting: Emergency Medicine

## 2023-07-22 ENCOUNTER — Other Ambulatory Visit: Payer: Self-pay

## 2023-07-22 ENCOUNTER — Encounter (HOSPITAL_COMMUNITY): Payer: Self-pay | Admitting: Emergency Medicine

## 2023-07-22 DIAGNOSIS — J069 Acute upper respiratory infection, unspecified: Secondary | ICD-10-CM | POA: Insufficient documentation

## 2023-07-22 LAB — GROUP A STREP BY PCR: Group A Strep by PCR: NOT DETECTED

## 2023-07-22 NOTE — ED Triage Notes (Signed)
Patient arrives ambulatory by POV c/o fever and sore throat onset of yesterday. Denies any sick contacts.

## 2023-07-22 NOTE — ED Provider Notes (Signed)
[name] Provider Note   CSN: [id number] Arrival date & time: 07/22/23  8295     History  Chief Complaint  Patient presents with   Sore Throat    Jared Conley is a 52 y.o. male with no known past medical history presents to emergency department for evaluation of sore throat, nonproductive cough, nasal congestion that started yesterday morning.  Patient endorses subjective fevers with no known fever.  He has been taking Tylenol as needed for subjective fever and pain.  Denies chest pain, shortness of breath, hemoptysis, known sick exposure.   Sore Throat Pertinent negatives include no chest pain, no abdominal pain, no headaches and no shortness of breath.      Home Medications Prior to Admission medications   Medication Sig Start Date End Date Taking? Authorizing Provider  famotidine (PEPCID) 20 MG tablet Take 1 tablet (20 mg total) by mouth 2 (two) times daily. 01/01/22   Tilden Fossa, MD  ondansetron (ZOFRAN) 4 MG tablet Take 1 tablet (4 mg total) by mouth every 6 (six) hours. 06/23/22   Small, Brooke L, PA      Allergies    Patient has no known allergies.    Review of Systems   Review of Systems  Constitutional:  Negative for chills, fatigue and fever.  HENT:  Positive for congestion and sore throat.   Respiratory:  Positive for cough. Negative for chest tightness, shortness of breath and wheezing.   Cardiovascular:  Negative for chest pain and palpitations.  Gastrointestinal:  Negative for abdominal pain, constipation, diarrhea, nausea and vomiting.  Neurological:  Negative for dizziness, seizures, weakness, light-headedness, numbness and headaches.    Physical Exam Updated Vital Signs BP 115/71   Pulse 65   Temp 97.7 F (36.5 C) (Oral)   Resp 18   Ht 5\' 3"  (1.6 m)   Wt 77.1 kg   SpO2 100%   BMI 30.11 kg/m  Physical Exam Vitals and nursing note reviewed.  Constitutional:      General: He is not in  acute distress.    Appearance: Normal appearance. He is not ill-appearing.  HENT:     Head: Normocephalic and atraumatic.     Right Ear: Tympanic membrane and ear canal normal.     Left Ear: Tympanic membrane and ear canal normal.     Nose: Congestion present.     Mouth/Throat:     Mouth: Mucous membranes are moist.     Tonsils: No tonsillar exudate or tonsillar abscesses. 1+ on the right. 1+ on the left.     Comments: Mild posterior oropharyngeal erythema without exudates Uvula midline without swelling Eyes:     Conjunctiva/sclera: Conjunctivae normal.  Cardiovascular:     Rate and Rhythm: Normal rate.  Pulmonary:     Effort: Pulmonary effort is normal. No respiratory distress.     Breath sounds: Normal breath sounds.  Musculoskeletal:     Cervical back: Normal range of motion and neck supple.  Lymphadenopathy:     Cervical: No cervical adenopathy.  Skin:    Capillary Refill: Capillary refill takes less than 2 seconds.     Coloration: Skin is not jaundiced or pale.     Findings: No rash.  Neurological:     General: No focal deficit present.     Mental Status: He is alert and oriented to person, place, and time. Mental status is at baseline.     ED Results / Procedures / Treatments  Labs (all labs ordered are listed, but only abnormal results are displayed) Labs Reviewed  GROUP A STREP BY PCR    EKG None  Radiology No results found.  Procedures Procedures    Medications Ordered in ED Medications - No data to display  ED Course/ Medical Decision Making/ A&P                                 Medical Decision Making   Patient presents to the ED for concern of sore throat, cough, nasal congestion, this involves an extensive number of treatment options, and is a complaint that carries with it a high risk of complications and morbidity.  The differential diagnosis includes viral URI, strep, bacterial infection. This is not an exhaustive list   Co morbidities  that complicate the patient evaluation  None   Additional history obtained:  Additional history obtained from Nursing and Outside Medical Records   External records from outside source obtained and reviewed   Lab Tests:  I Ordered, and personally interpreted labs.  The pertinent results include:  strep neg I offered COVID, RSV, flu testing and patient declined    Problem List / ED Course:  Viral URI   Reevaluation:  After the interventions noted above, I reevaluated the patient and found that they have :stayed the same    Dispostion:  Patient is resting comfortably in bed.  He is not ill-appearing.  Vital signs are within normal limits with no fever.  See HPI.  Patient complains of nasal congestion, cough, sore throat that started yesterday. Physical exam and significant for mild posterior erythremia likely secondary to postnasal drip.  There is no exudates.  Tonsils are 1+ bilaterally.  He denies difficulty swallowing, hoarse voice.  Uvula is midline.    Strep test is negative.  I offered COVID, flu, RSV testing and Tessalon Perles for cough but he declined. He reports that he took Tylenol this morning at 0800.  He has not had a fever in ED.  After consideration of the diagnostic results and the patients response to treatment, I feel that the patent would benefit from outpatient management and PCP follow-up.  He reports that he does not have a primary care provider.   Strep is negative. He reports that he does not have a primary care provider.   Do not feel that antibiotics are warranted. He reports that he does not have a primary care provider I will give him one upon discharge.  Discussed if symptoms persist for longer than a week, to follow-up with primary care provider for possible bacterial infection.  Discussed using Tylenol as needed for pain/fever, OTC symptomatic management, and maintaining oral hydration.  Disposition, return to emergency department precautions with  patient expressed understanding agrees with plan.  Return to emergency room precautions include but not limited to difficulty swallowing, horse voice, shortness of breath, chest pain. All questions answered to his satisfaction.        Final Clinical Impression(s) / ED Diagnoses Final diagnoses:  None    Rx / DC Orders ED Discharge Orders     None         Judithann Sheen, PA 07/22/23 0934    Rondel Baton, MD 07/26/23 1400

## 2023-07-22 NOTE — Discharge Instructions (Addendum)
Thank you for letting us evaluate you today.  Your strep swab was negative for the bacteria. You may use Tylenol as needed for pain/fever (>100.28F), cough drops for cough & sore throat, over the counter cold/flu medication for congestion.  Continue drinking water or Gatorade to avoid dehydration.  Provided you a work note for yesterday and today. Please follow up with PCP in one week in symptoms do not improve.  Return to emergency room precautions include but not limited to difficulty swallowing, horse voice, shortness of breath, chest pain

## 2024-06-17 ENCOUNTER — Emergency Department (HOSPITAL_COMMUNITY)
Admission: EM | Admit: 2024-06-17 | Discharge: 2024-06-17 | Disposition: A | Discharge status: Home / Self Care | Payer: Medicare (Managed Care) | Attending: Emergency Medicine | Admitting: Emergency Medicine

## 2024-06-17 ENCOUNTER — Other Ambulatory Visit: Payer: Self-pay

## 2024-06-17 ENCOUNTER — Encounter (HOSPITAL_COMMUNITY): Payer: Self-pay

## 2024-06-17 ENCOUNTER — Emergency Department (HOSPITAL_COMMUNITY): Payer: Medicare (Managed Care)

## 2024-06-17 DIAGNOSIS — R1032 Left lower quadrant pain: Secondary | ICD-10-CM | POA: Diagnosis present

## 2024-06-17 DIAGNOSIS — K529 Noninfective gastroenteritis and colitis, unspecified: Secondary | ICD-10-CM | POA: Insufficient documentation

## 2024-06-17 LAB — CBC
HCT: 52 % (ref 39.0–52.0)
Hemoglobin: 16.8 g/dL (ref 13.0–17.0)
MCH: 30.8 pg (ref 26.0–34.0)
MCHC: 32.3 g/dL (ref 30.0–36.0)
MCV: 95.2 fL (ref 80.0–100.0)
Platelets: 249 K/uL (ref 150–400)
RBC: 5.46 MIL/uL (ref 4.22–5.81)
RDW: 15.2 % (ref 11.5–15.5)
WBC: 8.2 K/uL (ref 4.0–10.5)
nRBC: 0 % (ref 0.0–0.2)

## 2024-06-17 LAB — COMPREHENSIVE METABOLIC PANEL WITH GFR
ALT: 26 U/L (ref 0–44)
AST: 28 U/L (ref 15–41)
Albumin: 3.9 g/dL (ref 3.5–5.0)
Alkaline Phosphatase: 90 U/L (ref 38–126)
Anion gap: 9 (ref 5–15)
BUN: 12 mg/dL (ref 6–20)
CO2: 24 mmol/L (ref 22–32)
Calcium: 9.4 mg/dL (ref 8.9–10.3)
Chloride: 106 mmol/L (ref 98–111)
Creatinine, Ser: 0.9 mg/dL (ref 0.61–1.24)
GFR, Estimated: >60 mL/min (ref >60)
Glucose, Bld: 104 mg/dL — ABNORMAL HIGH (ref 70–99)
Potassium: 4.6 mmol/L (ref 3.5–5.1)
Sodium: 138 mmol/L (ref 135–145)
Total Bilirubin: 0.6 mg/dL (ref 0.0–1.2)
Total Protein: 6.5 g/dL (ref 6.5–8.1)

## 2024-06-17 LAB — URINALYSIS, ROUTINE W REFLEX MICROSCOPIC
Bilirubin Urine: NEGATIVE
Glucose, UA: NEGATIVE mg/dL
Hgb urine dipstick: NEGATIVE
Ketones, ur: NEGATIVE mg/dL
Leukocytes,Ua: NEGATIVE
Nitrite: NEGATIVE
Protein, ur: NEGATIVE mg/dL
Specific Gravity, Urine: 1.025 (ref 1.005–1.030)
pH: 6 (ref 5.0–8.0)

## 2024-06-17 LAB — LIPASE, BLOOD: Lipase: 14 U/L (ref 11–51)

## 2024-06-17 MED ORDER — HYOSCYAMINE SULFATE 0.125 MG SL SUBL
0.2500 mg | SUBLINGUAL_TABLET | Freq: Once | SUBLINGUAL | Status: AC
  Administered 2024-06-17: 0.25 mg via SUBLINGUAL
  Filled 2024-06-17: qty 2

## 2024-06-17 MED ORDER — SODIUM CHLORIDE 0.9 % IV BOLUS
500.0000 mL | Freq: Once | INTRAVENOUS | Status: AC
  Administered 2024-06-17: 500 mL via INTRAVENOUS

## 2024-06-17 MED ORDER — LOPERAMIDE HCL 2 MG PO CAPS: 2.0000 mg | ORAL_CAPSULE | Freq: Four times a day (QID) | ORAL | 0 refills | Status: AC | PRN

## 2024-06-17 MED ORDER — ALUM & MAG HYDROXIDE-SIMETH 200-200-20 MG/5ML PO SUSP
30.0000 mL | Freq: Once | ORAL | Status: AC
  Administered 2024-06-17: 30 mL via ORAL
  Filled 2024-06-17: qty 30

## 2024-06-17 MED ORDER — DICYCLOMINE HCL 20 MG PO TABS: 20.0000 mg | ORAL_TABLET | Freq: Two times a day (BID) | ORAL | 0 refills | Status: AC

## 2024-06-17 MED ORDER — IOHEXOL 300 MG/ML  SOLN
100.0000 mL | Freq: Once | INTRAMUSCULAR | Status: AC | PRN
  Administered 2024-06-17: 100 mL via INTRAVENOUS

## 2024-06-17 MED ORDER — ONDANSETRON HCL 4 MG/2ML IJ SOLN
4.0000 mg | Freq: Once | INTRAMUSCULAR | Status: AC
  Administered 2024-06-17: 4 mg via INTRAVENOUS
  Filled 2024-06-17: qty 2

## 2024-06-17 MED ORDER — SODIUM CHLORIDE (PF) 0.9 % IJ SOLN
INTRAMUSCULAR | Status: AC
  Filled 2024-06-17: qty 50

## 2024-06-17 MED ORDER — ONDANSETRON 4 MG PO TBDP: 4.0000 mg | ORAL_TABLET | Freq: Three times a day (TID) | ORAL | 0 refills | Status: AC | PRN

## 2024-06-17 NOTE — ED Provider Notes (Signed)
 [place] EMERGENCY DEPARTMENT AT [facility] Provider Note   CSN: [id number] Arrival date & time: 06/17/24  1100     Patient presents with: Abdominal Pain   [place] Jared Conley is a 53 y.o. male with noncontributory past medical history reports with complaint of one day of abdominal pain, nausea and loose stool. Happened after eating yesterday, specifically french fries. Pain started in epigastric area, currently LLQ.  No fever, CP, SOB, vomiting. No recent antibiotics or travel. No pus or blood in stool.     Abdominal Pain      Prior to Admission medications   Medication Sig Start Date End Date Taking? Authorizing Provider  famotidine  (PEPCID ) 20 MG tablet Take 1 tablet (20 mg total) by mouth 2 (two) times daily. 01/01/22   Griselda Norris, MD  ondansetron  (ZOFRAN ) 4 MG tablet Take 1 tablet (4 mg total) by mouth every 6 (six) hours. 06/23/22   Small, Brooke L, PA    Allergies: Patient has no known allergies.    Review of Systems  Gastrointestinal:  Positive for abdominal pain.    Updated Vital Signs BP 124/77   Pulse 91   Temp 98.3 F (36.8 C) (Oral)   Resp 19   Ht 5' 2 (1.575 m)   Wt 74.8 kg   SpO2 99%   BMI 30.18 kg/m   Physical Exam Vitals and nursing note reviewed.  Constitutional:      General: He is not in acute distress.    Appearance: He is not toxic-appearing.  HENT:     Head: Normocephalic and atraumatic.  Eyes:     General: No scleral icterus.    Conjunctiva/sclera: Conjunctivae normal.  Cardiovascular:     Rate and Rhythm: Normal rate and regular rhythm.     Pulses: Normal pulses.     Heart sounds: Normal heart sounds.  Pulmonary:     Effort: Pulmonary effort is normal. No respiratory distress.     Breath sounds: Normal breath sounds.  Abdominal:     General: Abdomen is flat. Bowel sounds are normal.     Palpations: Abdomen is soft.     Tenderness: There is abdominal tenderness in the suprapubic area and left lower quadrant.   Skin:    General: Skin is warm and dry.     Findings: No lesion.  Neurological:     General: No focal deficit present.     Mental Status: He is alert and oriented to person, place, and time. Mental status is at baseline.     (all labs ordered are listed, but only abnormal results are displayed) Labs Reviewed  COMPREHENSIVE METABOLIC PANEL WITH GFR - Abnormal; Notable for the following components:      Result Value   Glucose, Bld 104 (*)    All other components within normal limits  LIPASE, BLOOD  CBC  URINALYSIS, ROUTINE W REFLEX MICROSCOPIC    EKG: None  Radiology: CT ABDOMEN PELVIS W CONTRAST Result Date: 06/17/2024 CLINICAL DATA:  Epigastric pain, diarrhea and nausea. EXAM: CT ABDOMEN AND PELVIS WITH CONTRAST TECHNIQUE: Multidetector CT imaging of the abdomen and pelvis was performed using the standard protocol following bolus administration of intravenous contrast. RADIATION DOSE REDUCTION: This exam was performed according to the departmental dose-optimization program which includes automated exposure control, adjustment of the mA and/or kV according to patient size and/or use of iterative reconstruction technique. CONTRAST:  OMNIPAQUE  IOHEXOL  300 MG/ML  SOLN COMPARISON:  01/01/2022. FINDINGS: Lower chest: Minimal dependent atelectasis. Heart size normal.  No pericardial effusion. No pleural effusion. Distal esophagus is grossly unremarkable. Hepatobiliary: Liver and gallbladder are unremarkable. No biliary ductal dilatation. Pancreas: Negative. Spleen: Negative. Adrenals/Urinary Tract: Adrenal glands and kidneys are unremarkable. Ureters are decompressed. Bladder is low in volume. Stomach/Bowel: Stomach, duodenum and proximal jejunum are unremarkable. Mild wall thickening and dilatation involving the distal jejunum and/or proximal ileum, without a transition point. Remainder of the small bowel, appendix and colon are unremarkable. Vascular/Lymphatic: Vascular structures are  unremarkable. No pathologically enlarged lymph nodes. Reproductive: Prostate is normal in size. Other: Small right inguinal hernia contains fat. Small pelvic free fluid. Musculoskeletal: Minimal degenerative change in the spine. IMPRESSION: 1. Mild wall thickening and dilatation involving the distal jejunum and/or proximal ileum, without a transition point. Findings can be seen with an enteritis. 2. Small pelvic free fluid. Electronically Signed   By: Newell Eke M.D.   On: 06/17/2024 13:44     Procedures   Medications Ordered in the ED  alum & mag hydroxide-simeth (MAALOX/MYLANTA) 200-200-20 MG/5ML suspension 30 mL (has no administration in time range)  hyoscyamine (LEVSIN SL) SL tablet 0.25 mg (has no administration in time range)  ondansetron  (ZOFRAN ) injection 4 mg (has no administration in time range)  sodium chloride  0.9 % bolus 500 mL (500 mLs Intravenous New Bag/Given 06/17/24 1157)                                    Medical Decision Making Amount and/or Complexity of Data Reviewed Labs: ordered.  Risk OTC drugs. Prescription drug management.   This patient presents to the ED for concern of abdominal pain, this involves an extensive number of treatment options, and is a complaint that carries with it a high risk of complications and morbidity.  The differential diagnosis includes cholecystitis, AAA, appendicitis, renal stone, UTI    Lab Tests:  I personally interpreted labs.  The pertinent results include:   No leukocytosis, CMP and Lipase unremarkable. UA unremarkable.    Imaging Studies ordered:  I ordered imaging studies including CT abd/pelvis.   I independently visualized and interpreted imaging which showed enteritis.  I agree with the radiologist interpretation   Cardiac Monitoring: / EKG:  The patient was maintained on a cardiac monitor.     Problem List / ED Course / Critical interventions / Medication management  Patient presents to ER with  complaint of generalized abdominal pain associated with nausea and loose stools.  On exam he does have some reproducible lower left quadrant abdominal pain thus obtain CT scan.  His CT scan showed enteritis, no diverticulitis or other acute complications. He denies pus or blood in stool. His lab work is overall reassuring.  He was given fluid bolus and GI cocktail here with improvement in symptoms.  He passed p.o. challenge.   I ordered medication including GI cocktail, zofran , NS.  Reevaluation of the patient after these medicines showed that the patient improved I have reviewed the patients home medicines and have made adjustments as needed. Patient overall hemodynamically stable and well-appearing.  Patient is tolerating oral intake.  Complaints are well-controlled.  Feel stable for discharge with outpatient follow-up.     Final diagnoses:  Gastroenteritis    ED Discharge Orders          Ordered    ondansetron  (ZOFRAN -ODT) 4 MG disintegrating tablet  Every 8 hours PRN        06/17/24 1349  dicyclomine (BENTYL) 20 MG tablet  2 times daily        06/17/24 1349    loperamide (IMODIUM) 2 MG capsule  4 times daily PRN        06/17/24 1350               Zaden Sako, Warren SAILOR, PA-C 06/17/24 1751    Garrick Charleston, MD 06/18/24 1557

## 2024-06-17 NOTE — ED Triage Notes (Signed)
 Patient said he ate french fries yesterday and then his stomach began hurting in the epigastric area. Has had diarrhea and feels nauseous.

## 2024-06-17 NOTE — Discharge Instructions (Addendum)
 Make sure you are staying well-hydrated with water you can alternate Pedialyte or Gatorade.  Try bland diet.  Take Zofran  as needed for nausea and vomiting.  You can take Bentyl as needed for abdominal pain. Trial Imodium as needed for loose stool.   Return to emergency room with new or worsening symptoms.
